# Patient Record
Sex: Male | Born: 2000 | Race: White | Hispanic: No | Marital: Single | Smoking: Never smoker
Health system: Southern US, Community
[De-identification: ages and names within clinical notes are randomized; demographics above are authoritative.]

## PROBLEM LIST (undated history)

## (undated) HISTORY — PX: WISDOM TOOTH EXTRACTION: SHX21

## (undated) HISTORY — PX: COLONOSCOPY: SHX174

---
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
## 2019-03-31 IMAGING — US US RENAL
1 series · 14 of 25 positions shown · non-contrast
Comparison: None.

CLINICAL DATA: Acute kidney injury.

EXAM:
RENAL / URINARY TRACT ULTRASOUND COMPLETE

[Series 1: us renal · 0.20mm/px · 14 of 31 slices shown]
[im 1/31]
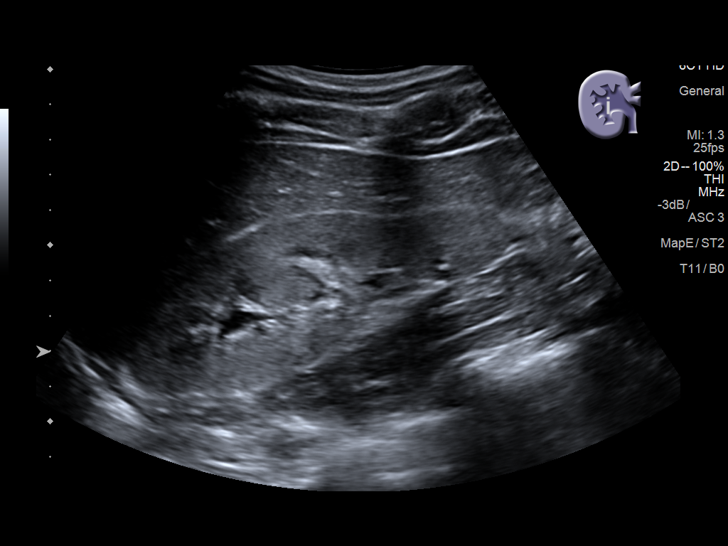
[im 3/31]
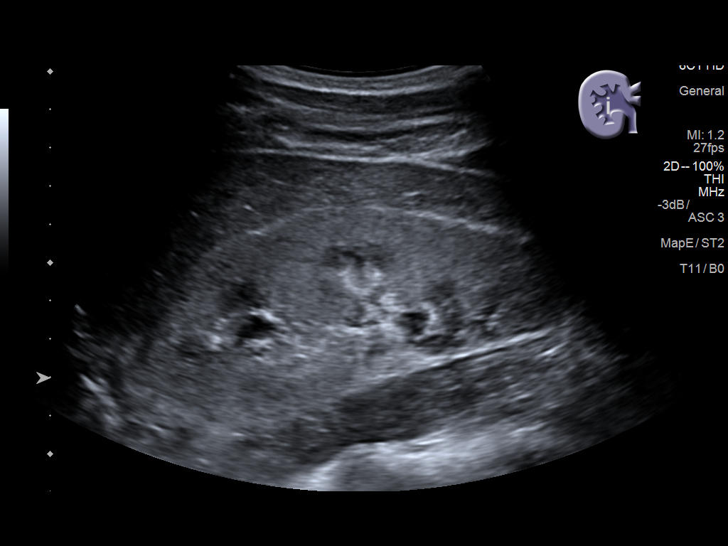
[im 6/31]
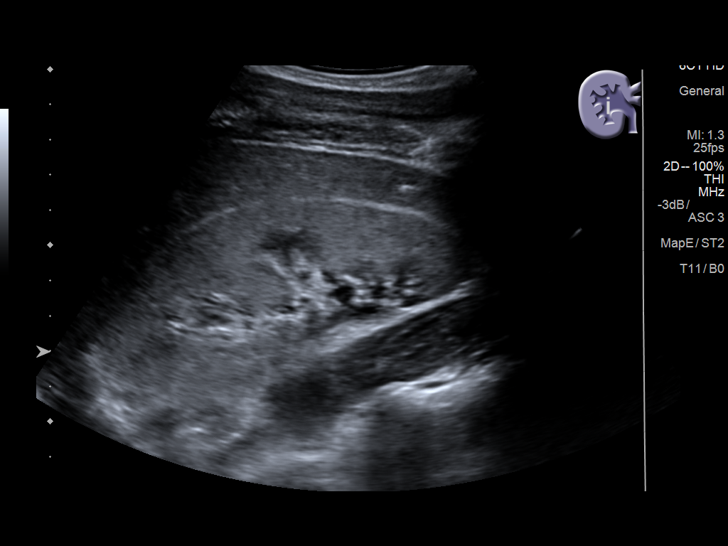
[im 8/31]
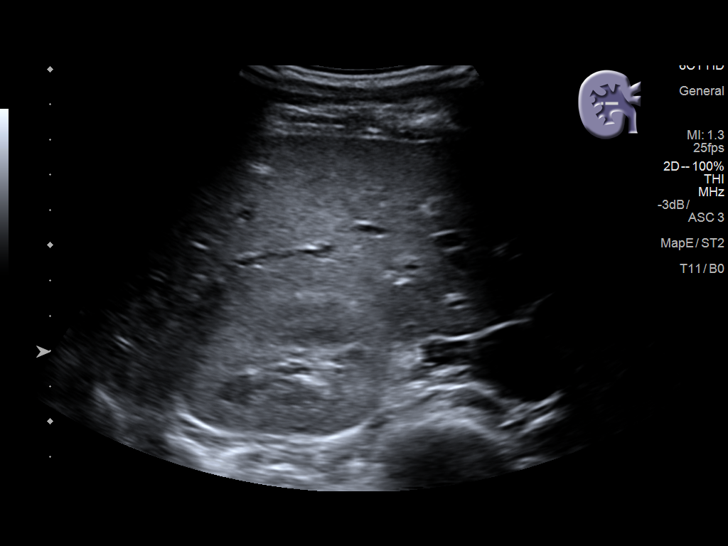
[im 11/31]
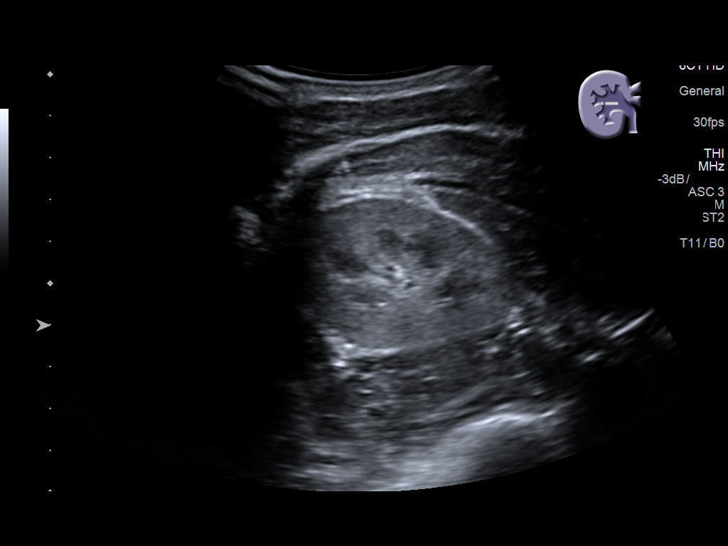
[im 12/31]
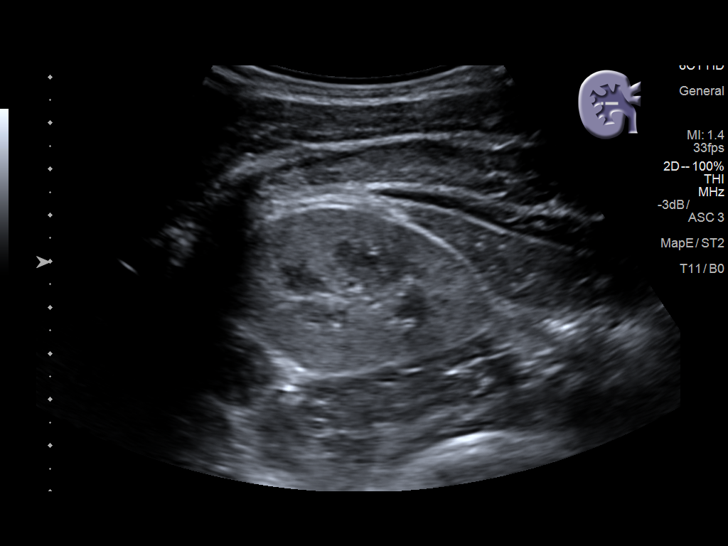
[im 14/31]
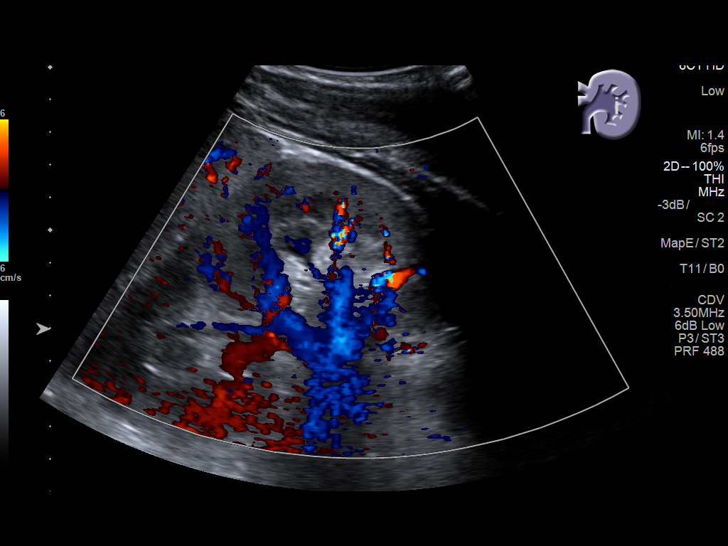
[im 17/31]
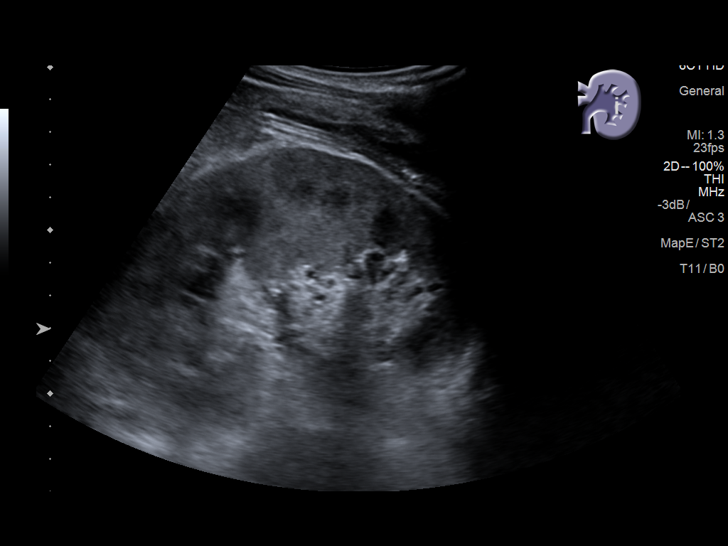
[im 19/31]
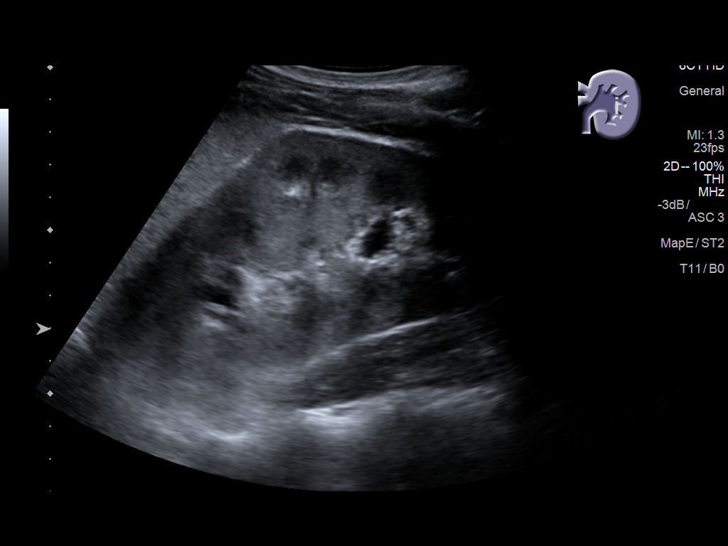
[im 21/31]
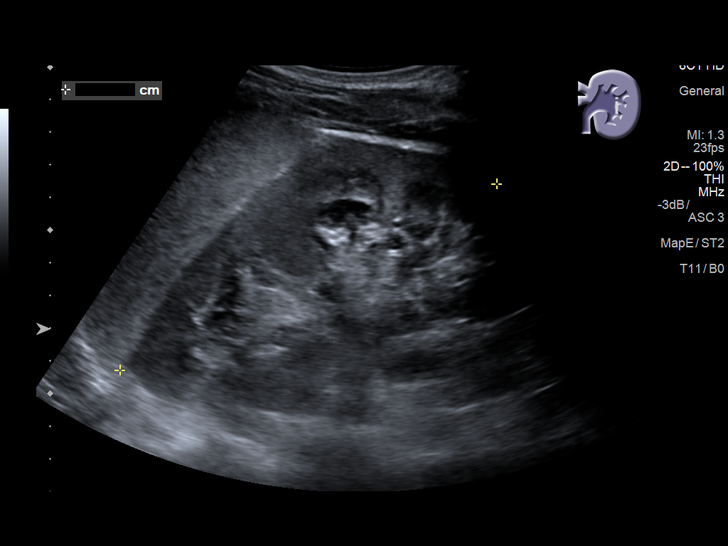
[im 23/31]
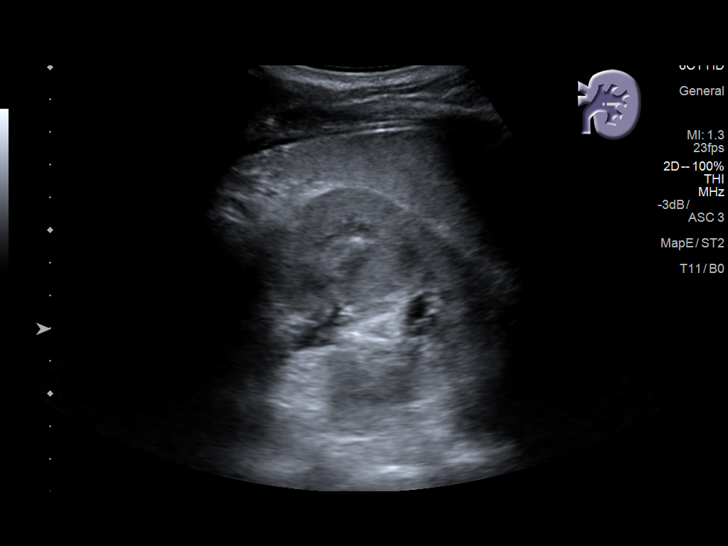
[im 26/31]
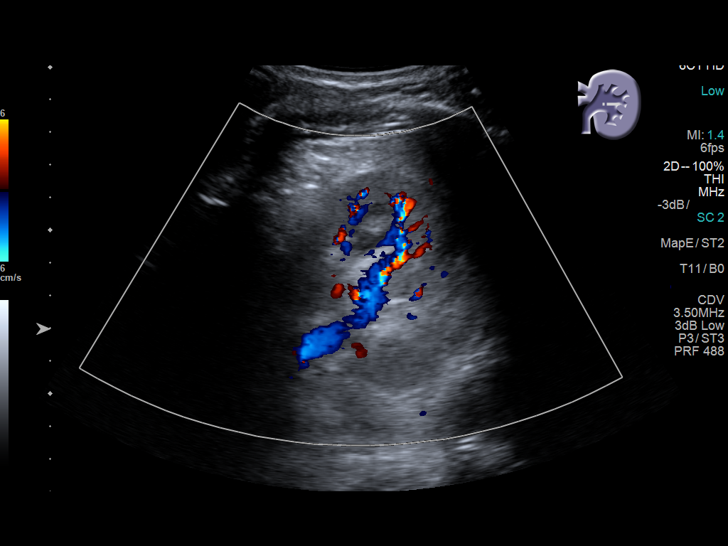
[im 28/31]
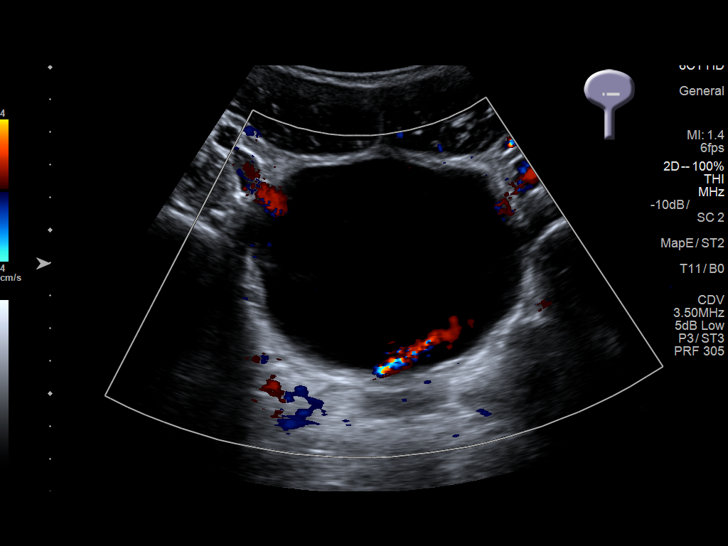
[im 31/31]
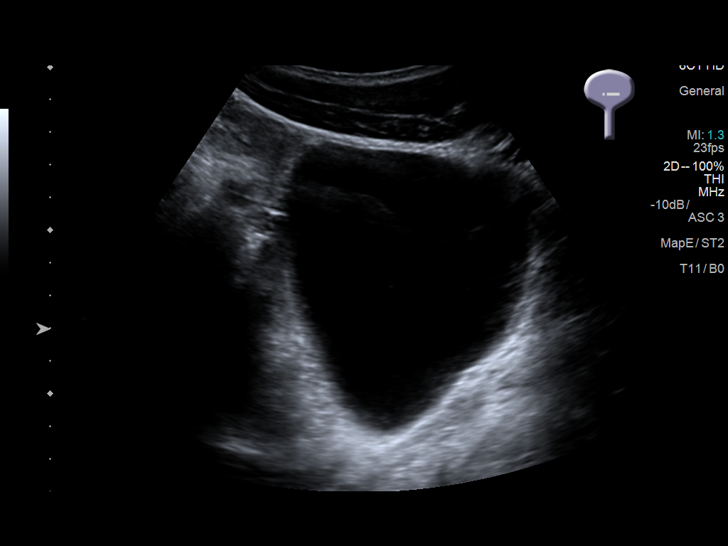

[14 of 25 positions shown; findings below may reference images not displayed]

FINDINGS: Right Kidney:

Length: 11.2 cm normal for a patient this age is 10.53 cm +/-
cm. Echogenicity is mildly increased. There is mild fullness of the
renal collecting system without frank hydronephrosis. No mass.

Left Kidney:

Length: 12.9 cm. Echogenicity is mildly increased. There is mild
fullness of the renal collecting system without frank
hydronephrosis. No mass.

Bladder:

Appears normal for degree of bladder distention.
IMPRESSION: Mildly increased cortical echogenicity bilaterally compatible with
medical renal disease.

Mild fullness of the renal collecting systems without frank
hydronephrosis.

## 2020-02-26 ENCOUNTER — Other Ambulatory Visit: Payer: Self-pay | Admitting: Orthopedic Surgery

## 2020-02-26 DIAGNOSIS — M25512 Pain in left shoulder: Secondary | ICD-10-CM

## 2020-04-21 ENCOUNTER — Other Ambulatory Visit: Payer: Self-pay

## 2020-04-21 ENCOUNTER — Ambulatory Visit
Admission: RE | Admit: 2020-04-21 | Discharge: 2020-04-21 | Disposition: A | Discharge status: Home / Self Care | Payer: BLUE CROSS/BLUE SHIELD | Source: Ambulatory Visit | Attending: Orthopedic Surgery | Admitting: Orthopedic Surgery

## 2020-04-21 DIAGNOSIS — M25512 Pain in left shoulder: Secondary | ICD-10-CM

## 2020-04-21 MED ORDER — IOPAMIDOL (ISOVUE-M 200) INJECTION 41%
13.0000 mL | Freq: Once | INTRAMUSCULAR | Status: AC
  Administered 2020-04-21: 13 mL via INTRA_ARTICULAR

## 2020-07-19 ENCOUNTER — Emergency Department (INDEPENDENT_AMBULATORY_CARE_PROVIDER_SITE_OTHER)
Admission: EM | Admit: 2020-07-19 | Discharge: 2020-07-19 | Disposition: A | Discharge status: Home / Self Care | Payer: BC Managed Care – PPO | Source: Home / Self Care | Attending: Family Medicine | Admitting: Family Medicine

## 2020-07-19 ENCOUNTER — Other Ambulatory Visit: Payer: Self-pay

## 2020-07-19 ENCOUNTER — Emergency Department (INDEPENDENT_AMBULATORY_CARE_PROVIDER_SITE_OTHER): Payer: BC Managed Care – PPO

## 2020-07-19 DIAGNOSIS — S93492A Sprain of other ligament of left ankle, initial encounter: Secondary | ICD-10-CM | POA: Diagnosis not present

## 2020-07-19 NOTE — ED Provider Notes (Signed)
[facility]    CSN: [id number] Arrival date & time: 07/19/20  1719      History   Chief Complaint Chief Complaint  Patient presents with  . Ankle Pain    LT    HPI Frederick Burke is a 19 y.o. male.   During PT at work this morning patient inverted his left ankle, and felt a cracking sensation.  He has had minimal swelling while wearing a well-fitting ankle brace.  His pain is moderate while taking ibuprofen.  The history is provided by the patient.  Ankle Pain Location:  Ankle Time since incident:  8 minutes Injury: yes   Mechanism of injury comment:  Inverted ankle Ankle location:  L ankle Pain details:    Quality:  Aching   Radiates to:  Does not radiate   Severity:  Moderate   Onset quality:  Sudden   Duration:  8 hours   Timing:  Constant   Progression:  Improving Chronicity:  New Prior injury to area:  No Relieved by:  Nothing Worsened by:  Activity Ineffective treatments:  None tried Associated symptoms: decreased ROM, stiffness and swelling   Associated symptoms: no numbness and no tingling     History reviewed. No pertinent past medical history.  Patient Active Problem List   Diagnosis Date Noted  . AKI (acute kidney injury) (HCC) 12/06/2017    Past Surgical History:  Procedure Laterality Date  . COLONOSCOPY    . WISDOM TOOTH EXTRACTION         Home Medications    Prior to Admission medications   Not on File    Family History History reviewed. No pertinent family history.  Social History Social History   Tobacco Use  . Smoking status: Never Smoker  . Smokeless tobacco: Never Used  Vaping Use  . Vaping Use: Never used  Substance Use Topics  . Alcohol use: Not on file  . Drug use: Not on file     Allergies   Patient has no known allergies.   Review of Systems Review of Systems  Constitutional: Negative.   Musculoskeletal: Positive for joint swelling and stiffness.  Skin: Negative for color change.  All  other systems reviewed and are negative.    Physical Exam Triage Vital Signs ED Triage Vitals  Enc Vitals Group     BP 07/19/20 1729 136/77     Pulse Rate 07/19/20 1729 67     Resp 07/19/20 1729 18     Temp 07/19/20 1729 98.8 F (37.1 C)     Temp Source 07/19/20 1729 Oral     SpO2 07/19/20 1729 98 %     Weight 07/19/20 1731 171 lb (77.6 kg)     Height 07/19/20 1731 6\' 2"  (1.88 m)     Head Circumference --      Peak Flow --      Pain Score 07/19/20 1731 5     Pain Loc --      Pain Edu? --      Excl. in GC? --    No data found.  Updated Vital Signs BP 136/77 (BP Location: Left Arm)   Pulse 67   Temp 98.8 F (37.1 C) (Oral)   Resp 18   Ht 6\' 2"  (1.88 m)   Wt 77.6 kg   SpO2 98%   BMI 21.96 kg/m   Visual Acuity Right Eye Distance:   Left Eye Distance:   Bilateral Distance:    Right Eye Near:   Left  Eye Near:    Bilateral Near:     Physical Exam Vitals and nursing note reviewed.  Constitutional:      General: He is not in acute distress.    Appearance: He is normal weight.  HENT:     Head: Normocephalic.  Eyes:     Pupils: Pupils are equal, round, and reactive to light.  Cardiovascular:     Rate and Rhythm: Normal rate.  Pulmonary:     Effort: Pulmonary effort is normal.  Musculoskeletal:     Cervical back: Normal range of motion.     Left ankle: Swelling present. No deformity or ecchymosis. Tenderness present over the lateral malleolus and ATF ligament. No base of 5th metatarsal tenderness. Normal range of motion.       Feet:  Skin:    General: Skin is warm and dry.  Neurological:     Mental Status: He is alert.      UC Treatments / Results  Labs (all labs ordered are listed, but only abnormal results are displayed) Labs Reviewed - No data to display  EKG   Radiology DG Ankle Complete Left  Result Date: 07/19/2020 CLINICAL DATA:  Status post trauma. EXAM: LEFT ANKLE COMPLETE - 3+ VIEW COMPARISON:  Apr 23, 2017 FINDINGS: There is no evidence  of fracture, dislocation, or joint effusion. There is no evidence of arthropathy or other focal bone abnormality. Mild posterolateral soft tissue swelling is noted with disruption of the anterior aspect of Kager's fat pad noted on the lateral view. IMPRESSION: Mild posterolateral soft tissue swelling without evidence for fracture or dislocation. MRI correlation is recommended, as an underlying tendon injury cannot be excluded. Electronically Signed   By: Aram Candela M.D.   On: 07/19/2020 18:02    Procedures Procedures (including critical care time)  Medications Ordered in UC Medications - No data to display  Initial Impression / Assessment and Plan / UC Course  I have reviewed the triage vital signs and the nursing notes.  Pertinent labs & imaging results that were available during my care of the patient were reviewed by me and considered in my medical decision making (see chart for details).    No evidence fracture.  Ankle stable.  Patient already has well-fitting supportive ankle brace. Followup with Dr[facility] Rod[facility]) if not improving about two weeks.    Final Clinical Impressions(s) / UC Diagnoses   Final diagnoses:  Sprain of anterior talofibular ligament of left ankle, initial encounter     Discharge Instructions     Apply ice pack for 30 minutes every 1 to 2 hours today and tomorrow.  Elevate.  Wear splint/brace for about 2 to 3 weeks.  Begin range of motion and stretching exercises in about 5 days as per instruction sheet. May take Ibuprofen 200mg , 4 tabs every 8 hours with food.      ED Prescriptions    None        , MD 07/23/20 [phone]

## 2020-07-19 NOTE — Discharge Instructions (Addendum)
Apply ice pack for 30 minutes every 1 to 2 hours today and tomorrow.  Elevate.  Wear splint/brace for about 2 to 3 weeks.  Begin range of motion and stretching exercises in about 5 days as per instruction sheet. May take Ibuprofen 200mg , 4 tabs every 8 hours with food.

## 2020-07-19 NOTE — ED Triage Notes (Signed)
Pt c/o LT ankle pain since this morning when he rolled it during PT at work. Says he felt a crack when he fell. Pain 5/10. Ibuprofen at about 2pm.

## 2021-11-01 ENCOUNTER — Other Ambulatory Visit: Payer: Self-pay | Admitting: Orthopedic Surgery

## 2021-11-01 DIAGNOSIS — R52 Pain, unspecified: Secondary | ICD-10-CM

## 2021-11-27 ENCOUNTER — Ambulatory Visit
Admission: RE | Admit: 2021-11-27 | Discharge: 2021-11-27 | Disposition: A | Discharge status: Home / Self Care | Payer: BC Managed Care – PPO | Source: Ambulatory Visit | Attending: Orthopedic Surgery | Admitting: Orthopedic Surgery

## 2021-11-27 DIAGNOSIS — R52 Pain, unspecified: Secondary | ICD-10-CM

## 2022-07-03 ENCOUNTER — Encounter: Payer: Self-pay | Admitting: Gastroenterology

## 2022-07-03 ENCOUNTER — Encounter (HOSPITAL_BASED_OUTPATIENT_CLINIC_OR_DEPARTMENT_OTHER): Payer: Self-pay

## 2022-07-03 ENCOUNTER — Other Ambulatory Visit: Payer: Self-pay

## 2022-07-03 ENCOUNTER — Emergency Department (HOSPITAL_BASED_OUTPATIENT_CLINIC_OR_DEPARTMENT_OTHER)
Admission: EM | Admit: 2022-07-03 | Discharge: 2022-07-03 | Disposition: A | Discharge status: Home / Self Care | Payer: BC Managed Care – PPO | Attending: Emergency Medicine | Admitting: Emergency Medicine

## 2022-07-03 ENCOUNTER — Emergency Department (HOSPITAL_BASED_OUTPATIENT_CLINIC_OR_DEPARTMENT_OTHER): Payer: BC Managed Care – PPO

## 2022-07-03 DIAGNOSIS — R1031 Right lower quadrant pain: Secondary | ICD-10-CM | POA: Diagnosis present

## 2022-07-03 DIAGNOSIS — K529 Noninfective gastroenteritis and colitis, unspecified: Secondary | ICD-10-CM | POA: Insufficient documentation

## 2022-07-03 LAB — COMPREHENSIVE METABOLIC PANEL
ALT: 34 U/L (ref 0–44)
AST: 41 U/L (ref 15–41)
Albumin: 5.1 g/dL — ABNORMAL HIGH (ref 3.5–5.0)
Alkaline Phosphatase: 63 U/L (ref 38–126)
Anion gap: 11 (ref 5–15)
BUN: 13 mg/dL (ref 6–20)
CO2: 27 mmol/L (ref 22–32)
Calcium: 10.4 mg/dL — ABNORMAL HIGH (ref 8.9–10.3)
Chloride: 102 mmol/L (ref 98–111)
Creatinine, Ser: 0.97 mg/dL (ref 0.61–1.24)
GFR, Estimated: >60 mL/min (ref >60)
Glucose, Bld: 93 mg/dL (ref 70–99)
Potassium: 4.3 mmol/L (ref 3.5–5.1)
Sodium: 140 mmol/L (ref 135–145)
Total Bilirubin: 0.8 mg/dL (ref 0.3–1.2)
Total Protein: 7.3 g/dL (ref 6.5–8.1)

## 2022-07-03 LAB — CBC
HCT: 49.1 % (ref 39.0–52.0)
Hemoglobin: 17.2 g/dL — ABNORMAL HIGH (ref 13.0–17.0)
MCH: 30.9 pg (ref 26.0–34.0)
MCHC: 35 g/dL (ref 30.0–36.0)
MCV: 88.2 fL (ref 80.0–100.0)
Platelets: 234 10*3/uL (ref 150–400)
RBC: 5.57 MIL/uL (ref 4.22–5.81)
RDW: 12 % (ref 11.5–15.5)
WBC: 8.8 10*3/uL (ref 4.0–10.5)
nRBC: 0 % (ref 0.0–0.2)

## 2022-07-03 LAB — URINALYSIS, ROUTINE W REFLEX MICROSCOPIC
Bilirubin Urine: NEGATIVE
Glucose, UA: NEGATIVE mg/dL
Hgb urine dipstick: NEGATIVE
Ketones, ur: NEGATIVE mg/dL
Leukocytes,Ua: NEGATIVE
Nitrite: NEGATIVE
Protein, ur: NEGATIVE mg/dL
Specific Gravity, Urine: 1.018 (ref 1.005–1.030)
pH: 6 (ref 5.0–8.0)

## 2022-07-03 LAB — LIPASE, BLOOD: Lipase: <5 U/L — ABNORMAL LOW (ref 11–51)

## 2022-07-03 MED ORDER — IOHEXOL 300 MG/ML  SOLN
100.0000 mL | Freq: Once | INTRAMUSCULAR | Status: AC | PRN
  Administered 2022-07-03: 85 mL via INTRAVENOUS

## 2022-07-03 NOTE — ED Provider Notes (Signed)
[facility] EMERGENCY DEPT Provider Note   CSN: [id number] Arrival date & time: 07/03/22  1046     History  Chief Complaint  Patient presents with   Abdominal Pain    Frederick Burke is a 21 y.o. male.   Abdominal Pain  21 year old male without significant past medical history presenting to the emergency department with right lower quadrant abdominal pain.  The patient reports symptoms for 2 days.  He reports his symptoms are waxing and waning.  He reports the pain is sharp.  Denies associated nausea, vomiting, fevers, chills, diarrhea.  Denies testicular pain, dysuria.  Reports normal bowel movements.  Has been eating normally.  Seen in urgent care and sent for further evaluation in the emergency department.      Home Medications Prior to Admission medications   Not on File      Allergies    Patient has no known allergies.    Review of Systems   Review of Systems  Gastrointestinal:  Positive for abdominal pain.  See HPI  Physical Exam Updated Vital Signs BP 130/76   Pulse (!) 56   Temp 97.7 F (36.5 C)   Resp 15   Ht 6\' 2"  (1.88 m)   Wt 77.6 kg   SpO2 100%   BMI 21.96 kg/m  Physical Exam Vitals and nursing note reviewed.  Constitutional:      General: He is not in acute distress.    Appearance: Normal appearance.  HENT:     Mouth/Throat:     Mouth: Mucous membranes are moist.  Cardiovascular:     Rate and Rhythm: Normal rate and regular rhythm.  Pulmonary:     Effort: Pulmonary effort is normal. No respiratory distress.     Breath sounds: Normal breath sounds.  Abdominal:     General: Abdomen is flat.     Palpations: Abdomen is soft.     Tenderness: There is abdominal tenderness (Focal right lower quadrant tenderness on exam).  Skin:    General: Skin is warm and dry.     Capillary Refill: Capillary refill takes less than 2 seconds.  Neurological:     Mental Status: He is alert and oriented to person, place, and time. Mental status  is at baseline.  Psychiatric:        Mood and Affect: Mood normal.        Behavior: Behavior normal.     ED Results / Procedures / Treatments   Labs (all labs ordered are listed, but only abnormal results are displayed) Labs Reviewed  LIPASE, BLOOD - Abnormal; Notable for the following components:      Result Value   Lipase <5 (*)    All other components within normal limits  COMPREHENSIVE METABOLIC PANEL - Abnormal; Notable for the following components:   Calcium 10.4 (*)    Albumin 5.1 (*)    All other components within normal limits  CBC - Abnormal; Notable for the following components:   Hemoglobin 17.2 (*)    All other components within normal limits  URINALYSIS, ROUTINE W REFLEX MICROSCOPIC    EKG None  Radiology CT Abdomen Pelvis W Contrast  Result Date: 07/03/2022 CLINICAL DATA:  Right lower quadrant abdominal pain. EXAM: CT ABDOMEN AND PELVIS WITH CONTRAST TECHNIQUE: Multidetector CT imaging of the abdomen and pelvis was performed using the standard protocol following bolus administration of intravenous contrast. RADIATION DOSE REDUCTION: This exam was performed according to the departmental dose-optimization program which includes automated exposure control, adjustment of the  mA and/or kV according to patient size and/or use of iterative reconstruction technique. CONTRAST:  51mL OMNIPAQUE IOHEXOL 300 MG/ML  SOLN COMPARISON:  None Available. FINDINGS: Lower chest: No acute abnormality. Hepatobiliary: No gallstones or biliary dilatation is noted. Multiple small hepatic cysts are noted. Pancreas: Unremarkable. No pancreatic ductal dilatation or surrounding inflammatory changes. Spleen: Normal in size without focal abnormality. Adrenals/Urinary Tract: Adrenal glands appear normal. Small nonobstructive left renal calculus is noted. No hydronephrosis or renal obstruction is noted. Urinary bladder is unremarkable. Stomach/Bowel: The stomach appears normal. There is no definite  evidence of bowel obstruction. The appendix is not clearly visualized. There is noted wall thickening of the right colon concerning for infectious or inflammatory colitis. Vascular/Lymphatic: Enlarged mesenteric lymph nodes are noted in the right lower quadrant suggesting mesenteric adenitis. The largest measures 13 mm. No significant vascular abnormality is noted. Reproductive: Prostate is unremarkable. Other: No abdominal wall hernia or abnormality. Small amount of free fluid is noted in the pelvis posteriorly. Musculoskeletal: No acute or significant osseous findings. IMPRESSION: Moderate wall thickening is seen involving the right colon concerning for infectious or inflammatory colitis. Crohn's disease cannot be excluded. Small amount of free fluid is also noted posteriorly in the pelvis which may be related to bowel inflammation. Enlarged mesenteric lymph nodes are noted in the right lower quadrant which may be secondary to this inflammation. The appendix is not clearly visualized. Electronically Signed   By: Lupita Raider M.D.   On: 07/03/2022 12:34    Procedures Procedures    Medications Ordered in ED Medications  iohexol (OMNIPAQUE) 300 MG/ML solution 100 mL (85 mLs Intravenous Contrast Given 07/03/22 1216)    ED Course/ Medical Decision Making/ A&P Clinical Course as of 07/03/22 1315  Tue Jul 03, 2022  1245 CT scan without appendicitis but inflammation of colon in RLQ concerning for infectious/inflammatory colitis. Will discuss with GI for follow up  [WS]  1314 Discussed with Dr. Myrtie Neither, agrees with outpatient GI referral. Placed referral. Will discharge patient to home. All questions answered. Patient comfortable with plan of discharge. Return precautions discussed with patient and specified on the after visit summary.  [WS]    Clinical Course User Index [WS] Lonell Grandchild, MD                           Medical Decision Making Amount and/or Complexity of Data Reviewed Labs:  ordered. Radiology: ordered.  Risk Prescription drug management.   21 year old male presenting to the emergency department with abdominal pain.  On exam, patient has focal right lower quadrant tenderness.  Otherwise well-appearing with reassuring exam.  Given focal tenderness, some concern for appendicitis, so obtain CT scan to further evaluate, although patient has no associated symptoms.  Differential also includes muscular strain, kidney stone.  Low concern for cholecystitis, pancreatitis given location of pain.  Doubt obstruction, perforation without nausea, vomiting.  Patient declines pain medication at this time.  Denies any testicular pain which would suggest torsion or epididymitis.  Final Clinical Impression(s) / ED Diagnoses Final diagnoses:  Colitis    Rx / DC Orders ED Discharge Orders          Ordered    Ambulatory referral to Gastroenterology        07/03/22 1303              Lonell Grandchild, MD 07/03/22 1315

## 2022-07-03 NOTE — ED Triage Notes (Signed)
Patient here POV from UC  Endorses Right Sided ABD Pain that Began approximately 36-48 Hours ago. Constant in [place] and Radiates to Left ABD at Times.  No Fevers. No N/V/D. No Constipation. No Urinary Symptoms.   Sent for Imaging for R/O Appendicitis

## 2022-07-26 ENCOUNTER — Encounter: Payer: Self-pay | Admitting: Gastroenterology

## 2022-08-03 ENCOUNTER — Ambulatory Visit: Payer: BC Managed Care – PPO | Admitting: Gastroenterology

## 2022-08-28 ENCOUNTER — Ambulatory Visit: Payer: BC Managed Care – PPO | Admitting: Gastroenterology

## 2023-06-07 ENCOUNTER — Other Ambulatory Visit: Payer: Self-pay | Admitting: Nurse Practitioner

## 2023-06-07 ENCOUNTER — Ambulatory Visit
Admission: RE | Admit: 2023-06-07 | Discharge: 2023-06-07 | Disposition: A | Discharge status: Home / Self Care | Payer: No Typology Code available for payment source | Source: Ambulatory Visit | Attending: Nurse Practitioner | Admitting: Nurse Practitioner

## 2023-06-07 DIAGNOSIS — Z021 Encounter for pre-employment examination: Secondary | ICD-10-CM
